# Patient Record
Sex: Male | Born: 1985 | Race: White | Hispanic: No | Marital: Single | Smoking: Never smoker
Health system: Southern US, Community
[De-identification: ages and names within clinical notes are randomized; demographics above are authoritative.]

## PROBLEM LIST (undated history)

## (undated) DIAGNOSIS — I10 Essential (primary) hypertension: Secondary | ICD-10-CM

---

## 2019-05-18 ENCOUNTER — Other Ambulatory Visit: Payer: Self-pay | Admitting: Infectious Diseases

## 2019-05-18 DIAGNOSIS — R7989 Other specified abnormal findings of blood chemistry: Secondary | ICD-10-CM

## 2019-05-22 ENCOUNTER — Ambulatory Visit
Admission: RE | Admit: 2019-05-22 | Discharge: 2019-05-22 | Disposition: A | Discharge status: Home / Self Care | Payer: BC Managed Care – PPO | Source: Ambulatory Visit | Attending: Infectious Diseases | Admitting: Infectious Diseases

## 2019-05-22 ENCOUNTER — Other Ambulatory Visit: Payer: Self-pay

## 2019-05-22 DIAGNOSIS — R7989 Other specified abnormal findings of blood chemistry: Secondary | ICD-10-CM | POA: Insufficient documentation

## 2020-07-30 IMAGING — US US ABDOMEN LIMITED
1 series · 14 of 25 positions shown · non-contrast
Comparison: None.

CLINICAL DATA: Elevated LFTs

EXAM:
ULTRASOUND ABDOMEN LIMITED RIGHT UPPER QUADRANT

[Series 1: us abdomen limited · 0.19mm/px · 14 of 36 slices shown]
[im 1/36]
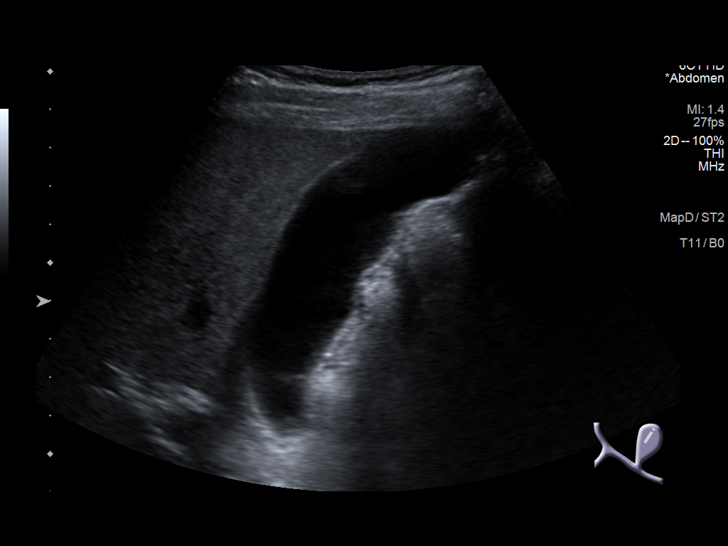
[im 3/36]
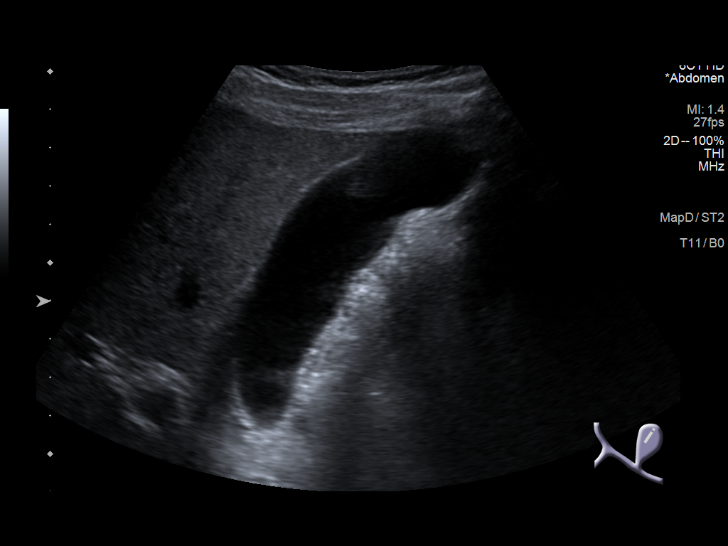
[im 6/36]
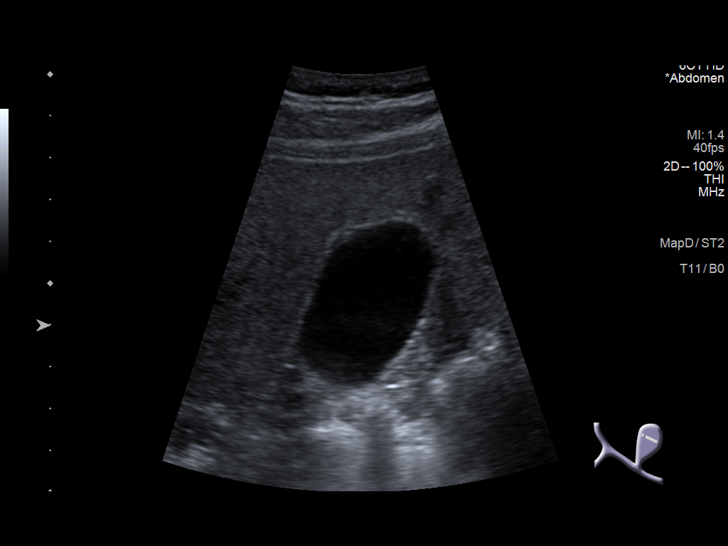
[im 9/36]
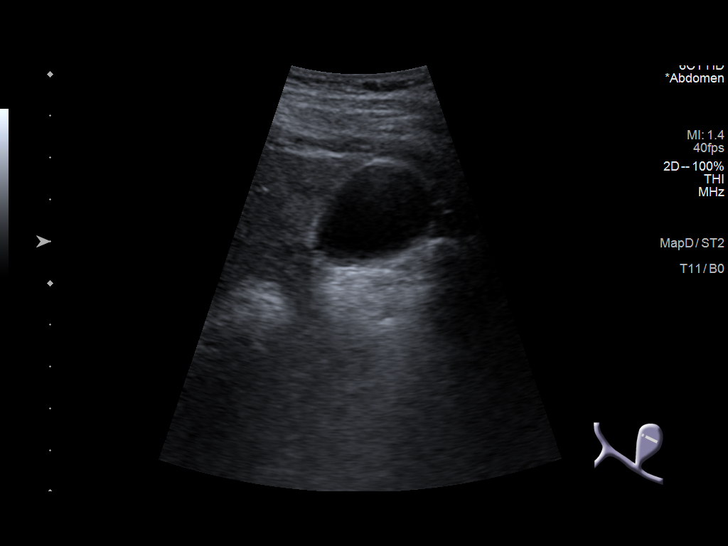
[im 12/36]
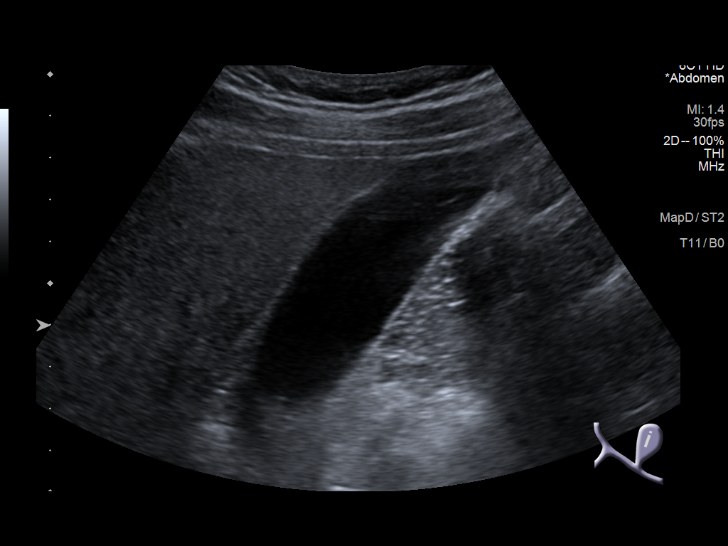
[im 14/36]
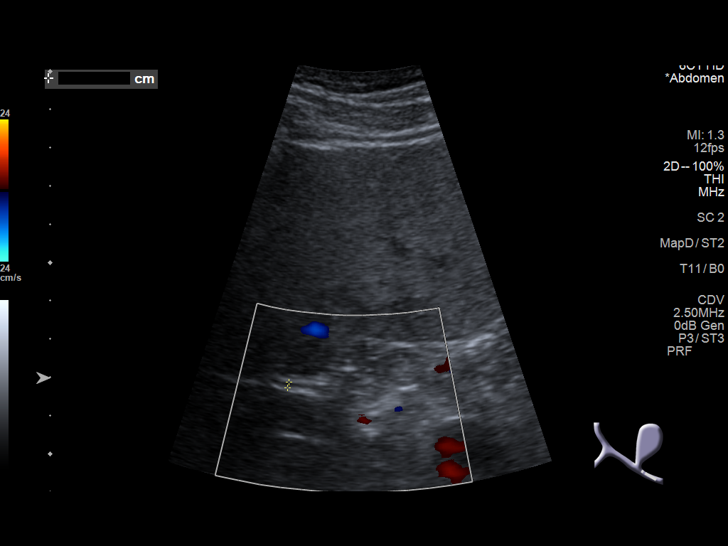
[im 17/36]
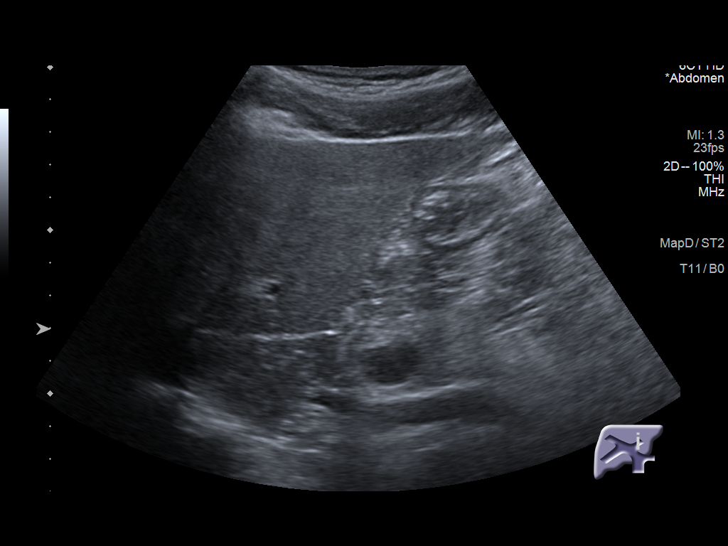
[im 19/36]
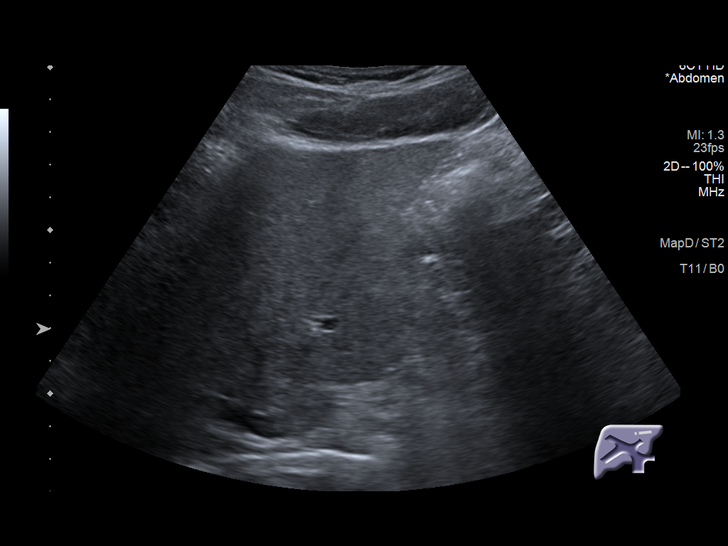
[im 22/36]
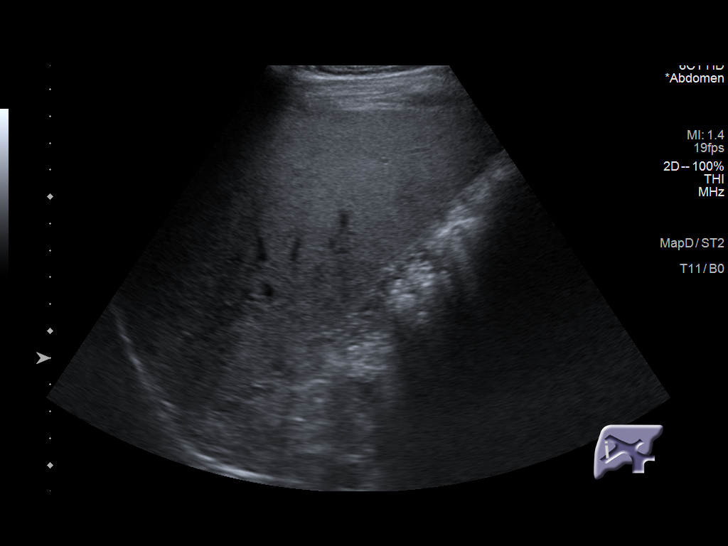
[im 24/36]
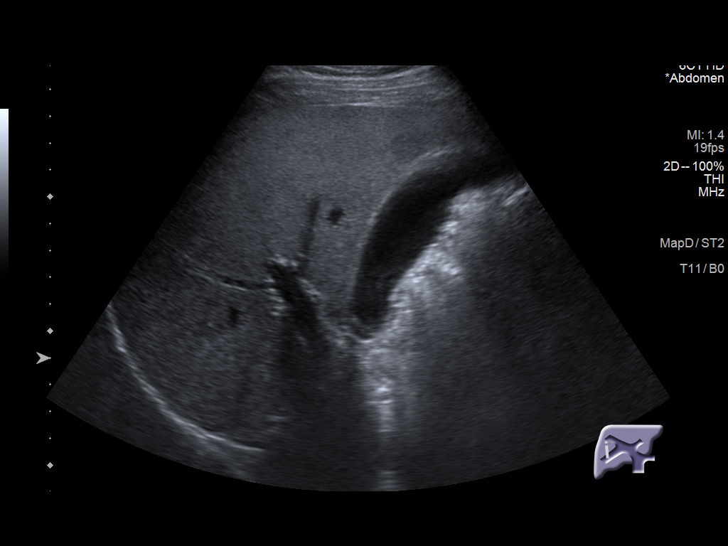
[im 27/36]
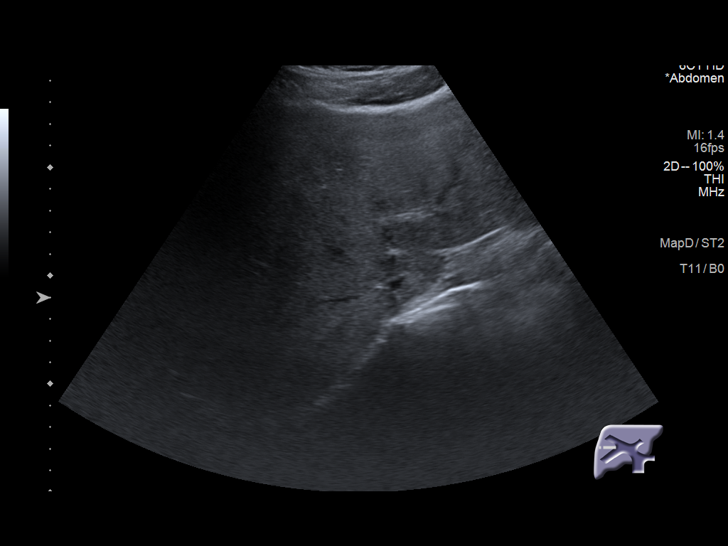
[im 30/36]
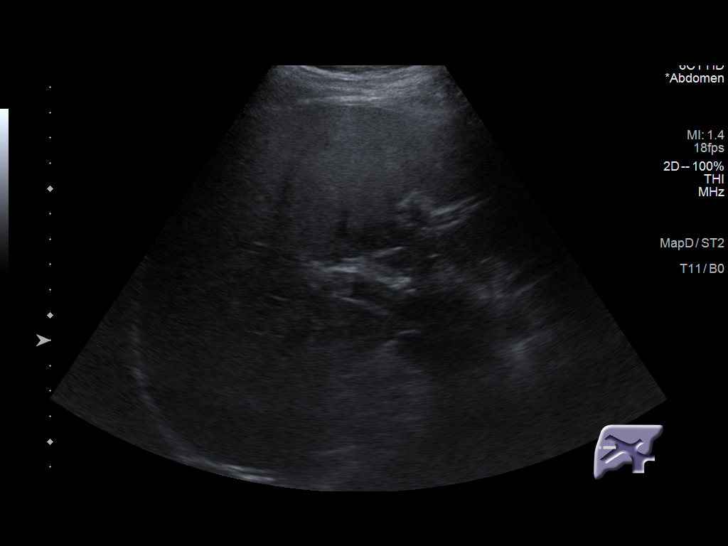
[im 33/36]
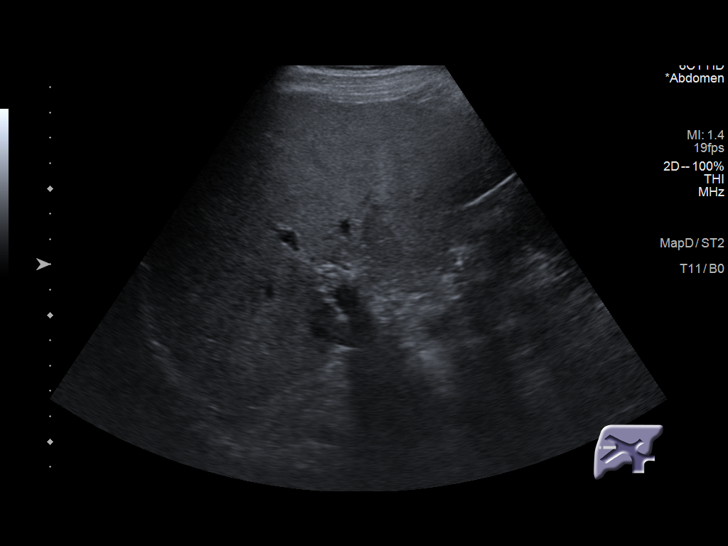
[im 36/36]
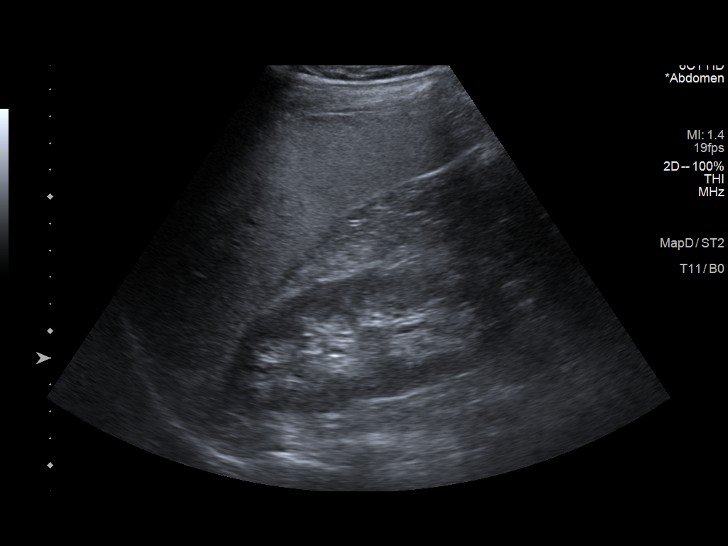

[14 of 25 positions shown; findings below may reference images not displayed]

FINDINGS: Gallbladder:

No gallstones or wall thickening visualized. No sonographic Murphy
sign noted by sonographer.

Common bile duct:

Diameter: 0.2 cm, within normal limits.

Liver:

No focal lesion identified. The liver parenchymal echogenicity is
diffusely increased. Portal vein is patent on color Doppler imaging
with normal direction of blood flow towards the liver.

Other: None.
IMPRESSION: Diffusely increased liver parenchymal echogenicity most commonly
seen in hepatic steatosis.

## 2020-10-21 ENCOUNTER — Emergency Department: Payer: BC Managed Care – PPO

## 2020-10-21 ENCOUNTER — Encounter: Payer: Self-pay | Admitting: Intensive Care

## 2020-10-21 ENCOUNTER — Other Ambulatory Visit: Payer: Self-pay

## 2020-10-21 ENCOUNTER — Emergency Department
Admission: EM | Admit: 2020-10-21 | Discharge: 2020-10-21 | Disposition: A | Discharge status: Home / Self Care | Payer: BC Managed Care – PPO | Attending: Emergency Medicine | Admitting: Emergency Medicine

## 2020-10-21 DIAGNOSIS — E860 Dehydration: Secondary | ICD-10-CM | POA: Diagnosis not present

## 2020-10-21 DIAGNOSIS — R112 Nausea with vomiting, unspecified: Secondary | ICD-10-CM | POA: Diagnosis present

## 2020-10-21 DIAGNOSIS — I10 Essential (primary) hypertension: Secondary | ICD-10-CM | POA: Diagnosis not present

## 2020-10-21 DIAGNOSIS — R739 Hyperglycemia, unspecified: Secondary | ICD-10-CM

## 2020-10-21 DIAGNOSIS — U071 COVID-19: Secondary | ICD-10-CM | POA: Diagnosis not present

## 2020-10-21 HISTORY — DX: Essential (primary) hypertension: I10

## 2020-10-21 LAB — CBC
HCT: 46.8 % (ref 39.0–52.0)
Hemoglobin: 16.7 g/dL (ref 13.0–17.0)
MCH: 31.3 pg (ref 26.0–34.0)
MCHC: 35.7 g/dL (ref 30.0–36.0)
MCV: 87.8 fL (ref 80.0–100.0)
Platelets: 189 10*3/uL (ref 150–400)
RBC: 5.33 MIL/uL (ref 4.22–5.81)
RDW: 12.3 % (ref 11.5–15.5)
WBC: 9.5 10*3/uL (ref 4.0–10.5)
nRBC: 0 % (ref 0.0–0.2)

## 2020-10-21 LAB — COMPREHENSIVE METABOLIC PANEL
ALT: 51 U/L — ABNORMAL HIGH (ref 0–44)
AST: 45 U/L — ABNORMAL HIGH (ref 15–41)
Albumin: 4.7 g/dL (ref 3.5–5.0)
Alkaline Phosphatase: 99 U/L (ref 38–126)
Anion gap: 11 (ref 5–15)
BUN: 11 mg/dL (ref 6–20)
CO2: 29 mmol/L (ref 22–32)
Calcium: 9.7 mg/dL (ref 8.9–10.3)
Chloride: 96 mmol/L — ABNORMAL LOW (ref 98–111)
Creatinine, Ser: 0.91 mg/dL (ref 0.61–1.24)
GFR, Estimated: >60 mL/min (ref >60)
Glucose, Bld: 198 mg/dL — ABNORMAL HIGH (ref 70–99)
Potassium: 3.6 mmol/L (ref 3.5–5.1)
Sodium: 136 mmol/L (ref 135–145)
Total Bilirubin: 0.8 mg/dL (ref 0.3–1.2)
Total Protein: 8.1 g/dL (ref 6.5–8.1)

## 2020-10-21 LAB — HEMOGLOBIN A1C
Hgb A1c MFr Bld: 5.6 % (ref 4.8–5.6)
Mean Plasma Glucose: 114.02 mg/dL

## 2020-10-21 LAB — CBG MONITORING, ED: Glucose-Capillary: 216 mg/dL — ABNORMAL HIGH (ref 70–99)

## 2020-10-21 LAB — LIPASE, BLOOD: Lipase: 28 U/L (ref 11–51)

## 2020-10-21 MED ORDER — LOPERAMIDE HCL 2 MG PO TABS
4.0000 mg | ORAL_TABLET | Freq: Four times a day (QID) | ORAL | 0 refills | Status: AC | PRN
Start: 2020-10-21 — End: ?

## 2020-10-21 MED ORDER — ONDANSETRON 4 MG PO TBDP: 4.0000 mg | ORAL_TABLET | Freq: Three times a day (TID) | ORAL | 0 refills | Status: AC | PRN

## 2020-10-21 NOTE — ED Provider Notes (Signed)
[facility] Emergency Department Provider Note  ____________________________________________  Time seen: Approximately 1:41 PM  I have reviewed the triage vital signs and the nursing notes.   HISTORY  Chief Complaint Hyperglycemia and Emesis    HPI Bradley Morris is a 35 y.o. male with a past history of hypertension and recent COVID-positive test on October 16, 2020 who comes ED complaining of nausea vomiting and diarrhea for the past few days.  He is able to drink some fluids, not able to eat.  Feels dehydrated and fatigued.  After a vomiting episode this morning, patient felt dizzy and had involuntary cramping and contracting of hand muscles bilaterally which was worsening so he called EMS.  EMS report an initial fingerstick blood sugar in the 500s.  Patient is not diabetic, denies polyuria or polydipsia, no vision changes.  On arrival to the ED his fingerstick blood sugar is 216 without receiving any IV fluids or medications.  Patient currently feels 100% better and ready to go home.  Past Medical History:  Diagnosis Date   Hypertension      There are no problems to display for this patient.    History reviewed. No pertinent surgical history.   Prior to Admission medications   Medication Sig Start Date End Date Taking? Authorizing Provider  loperamide (IMODIUM A-D) 2 MG tablet Take 2 tablets (4 mg total) by mouth 4 (four) times daily as needed for diarrhea or loose stools. 10/21/20  Yes Sharman Cheek, MD  ondansetron (ZOFRAN ODT) 4 MG disintegrating tablet Take 1 tablet (4 mg total) by mouth every 8 (eight) hours as needed for nausea or vomiting. 10/21/20  Yes Sharman Cheek, MD     Allergies Patient has no known allergies.   History reviewed. No pertinent family history.  Social History Social History   Tobacco Use   Smoking status: Never   Smokeless tobacco: Never  Substance Use Topics   Alcohol use: Never   Drug use: Never     Review of Systems  Constitutional:   No fever positive chills.  ENT:   Positive sore throat. No rhinorrhea. Cardiovascular:   No chest pain or syncope. Respiratory:   No dyspnea positive cough. Gastrointestinal:   Negative for abdominal pain, positive vomiting and diarrhea.  Musculoskeletal:   Negative for focal pain or swelling All other systems reviewed and are negative except as documented above in ROS and HPI.  ____________________________________________   PHYSICAL EXAM:  VITAL SIGNS: ED Triage Vitals  Enc Vitals Group     BP 10/21/20 1157 (!) 122/97     Pulse Rate 10/21/20 1157 91     Resp 10/21/20 1157 16     Temp 10/21/20 1157 98.3 F (36.8 C)     Temp Source 10/21/20 1157 Oral     SpO2 10/21/20 1157 95 %     Weight 10/21/20 1158 170 lb (77.1 kg)     Height 10/21/20 1158 5\' 9"  (1.753 m)     Head Circumference --      Peak Flow --      Pain Score 10/21/20 1158 0     Pain Loc --      Pain Edu? --      Excl. in GC? --     Vital signs reviewed, nursing assessments reviewed.   Constitutional:   Alert and oriented. Non-toxic appearance. Eyes:   Conjunctivae are normal. EOMI. PERRL. ENT      Head:   Normocephalic and atraumatic.  Nose:   Wearing a mask.      Mouth/Throat:   Wearing a mask.      Neck:   No meningismus. Full ROM. Hematological/Lymphatic/Immunilogical:   No cervical lymphadenopathy. Cardiovascular:   RRR. Symmetric bilateral radial and DP pulses.  No murmurs. Cap refill less than 2 seconds. Respiratory:   Normal respiratory effort without tachypnea/retractions. Breath sounds are clear and equal bilaterally. No wheezes/rales/rhonchi. Gastrointestinal:   Soft and nontender. Non distended. There is no CVA tenderness.  No rebound, rigidity, or guarding. Genitourinary:   deferred Musculoskeletal:   Normal range of motion in all extremities. No joint effusions.  No lower extremity tenderness.  No edema. Neurologic:   Normal speech and language.   Motor grossly intact. No acute focal neurologic deficits are appreciated.  Skin:    Skin is warm, dry and intact. No rash noted.  No petechiae, purpura, or bullae.  ____________________________________________    LABS (pertinent positives/negatives) (all labs ordered are listed, but only abnormal results are displayed) Labs Reviewed  COMPREHENSIVE METABOLIC PANEL - Abnormal; Notable for the following components:      Result Value   Chloride 96 (*)    Glucose, Bld 198 (*)    AST 45 (*)    ALT 51 (*)    All other components within normal limits  CBG MONITORING, ED - Abnormal; Notable for the following components:   Glucose-Capillary 216 (*)    All other components within normal limits  LIPASE, BLOOD  CBC  URINALYSIS, COMPLETE (UACMP) WITH MICROSCOPIC  HEMOGLOBIN A1C   ____________________________________________   EKG    ____________________________________________    RADIOLOGY  DG Chest 1 View  Result Date: 10/21/2020 CLINICAL DATA:  Weakness. EXAM: CHEST  1 VIEW COMPARISON:  None. FINDINGS: The heart size and mediastinal contours are within normal limits. Both lungs are clear. The visualized skeletal structures are unremarkable. IMPRESSION: No active disease. Electronically Signed   By: Lupita Raider M.D.   On: 10/21/2020 12:38    ____________________________________________   PROCEDURES Procedures  ____________________________________________  DIFFERENTIAL DIAGNOSIS   Dehydration, electrolyte abnormality, pancreatitis, pneumonia, viral syndrome, hyperventilation syndrome  CLINICAL IMPRESSION / ASSESSMENT AND PLAN / ED COURSE  Medications ordered in the ED: Medications - No data to display  Pertinent labs & imaging results that were available during my care of the patient were reviewed by me and considered in my medical decision making (see chart for details).  Bradley Morris was evaluated in Emergency Department on 10/21/2020 for the symptoms described in  the history of present illness. He was evaluated in the context of the global COVID-19 pandemic, which necessitated consideration that the patient might be at risk for infection with the SARS-CoV-2 virus that causes COVID-19. Institutional protocols and algorithms that pertain to the evaluation of patients at risk for COVID-19 are in a state of rapid change based on information released by regulatory bodies including the CDC and federal and state organizations. These policies and algorithms were followed during the patient's care in the ED.   Patient presents with nausea vomiting and diarrhea related to COVID infection.  Vital signs are normal, he is calm and comfortable in the ED, nontoxic with reassuring exam and labs.  Stable for discharge with supportive care at home, Zofran and loperamide as needed.  The involuntary muscle cramping that caused him to call EMS has resolved spontaneously.  I suspect that vomiting caused him to hyperventilate until he had a functional hypocalcemia from respiratory alkalosis which has now subsided.  Low suspicion for stroke or bacterial infection.  Not septic.  Profoundly dehydrated.  Able to continue oral hydration and bland diet.  Blood sugar slightly over 200 here in the ED.  I think this is due to his acute viral infection and not indicative of underlying diabetes.  Discussed this with the patient with shared decision making in mind, and he agrees with deferring any hypoglycemic agents for now.  He can recheck his blood sugar in a week or 2 when he feels back to normal and follow-up with Dr. Sampson Goon.  I will add on a hemoglobin A1c for follow-up today.      ____________________________________________   FINAL CLINICAL IMPRESSION(S) / ED DIAGNOSES    Final diagnoses:  Dehydration  COVID-19 virus infection  Hyperglycemia     ED Discharge Orders          Ordered    ondansetron (ZOFRAN ODT) 4 MG disintegrating tablet  Every 8 hours PRN         10/21/20 1340    loperamide (IMODIUM A-D) 2 MG tablet  4 times daily PRN        10/21/20 1340            Portions of this note were generated with dragon dictation software. Dictation errors may occur despite best attempts at proofreading.    Sharman Cheek, MD 10/21/20 [phone]

## 2020-10-21 NOTE — ED Triage Notes (Signed)
Patient was diagnosed with covid 8/3 and has been intermittently nauseas. This AM woke up to emesis, tingling in lower extremities and reports weakness. Patient brought in by EMS with blood sugar in 500s. No known history diabetes.

## 2020-10-21 NOTE — ED Notes (Signed)
FIRST NURSE: Pt to ER via EMS from home with c/o n/v CBG 544, no hx of DM.  VSS

## 2021-12-30 IMAGING — CR DG CHEST 1V
1 series · 1 of 1 positions shown · non-contrast
Comparison: None.

CLINICAL DATA: Weakness.

EXAM:
CHEST  1 VIEW

[w chest pa]
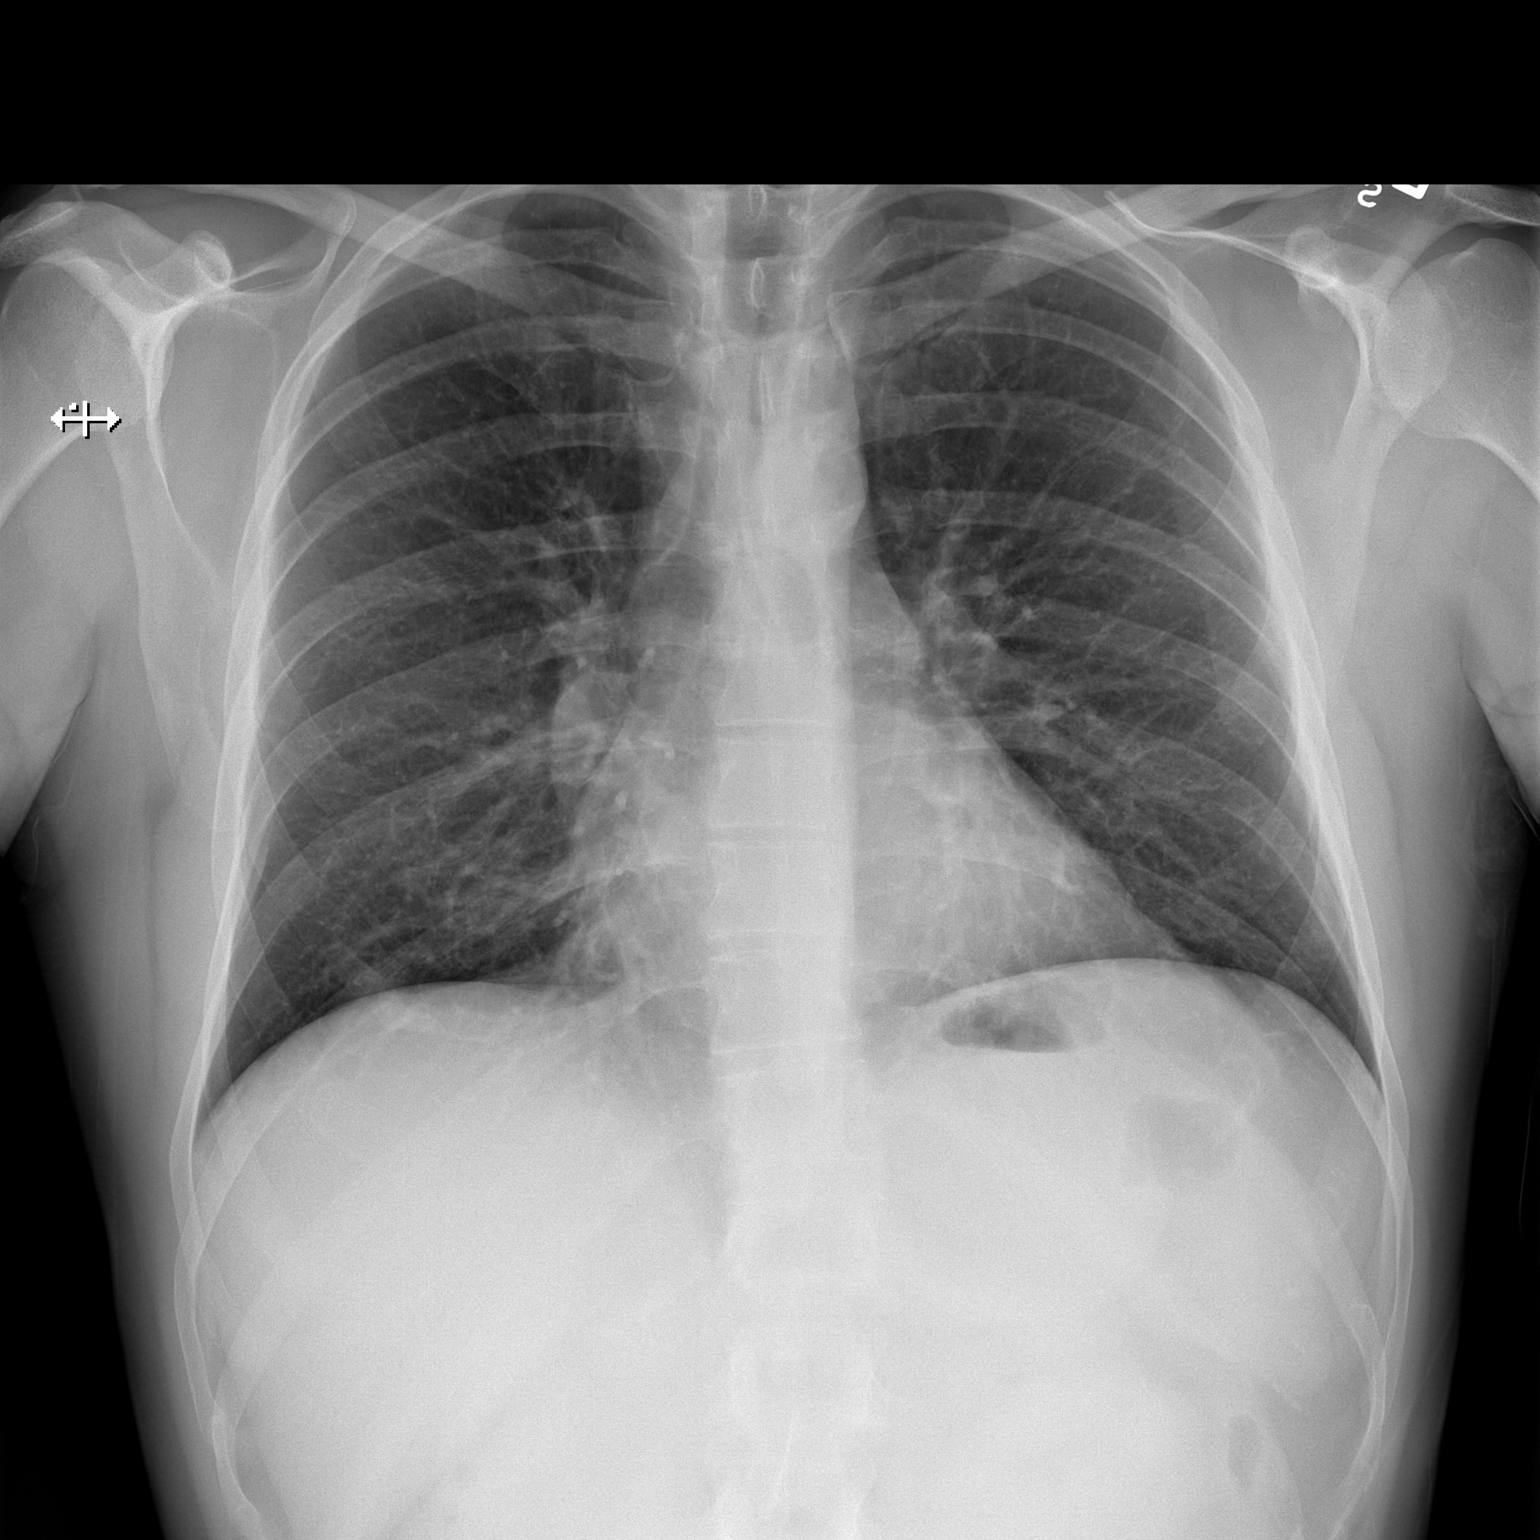

[1 of 1 positions shown; findings below may reference images not displayed]

FINDINGS: The heart size and mediastinal contours are within normal limits.
Both lungs are clear. The visualized skeletal structures are
unremarkable.
IMPRESSION: No active disease.
